# Patient Record
Sex: Male | Born: 1985 | Race: White | Hispanic: No | Marital: Married | Smoking: Never smoker
Health system: Southern US, Community
[De-identification: ages and names within clinical notes are randomized; demographics above are authoritative.]

---

## 2016-03-21 ENCOUNTER — Emergency Department (HOSPITAL_COMMUNITY): Payer: BLUE CROSS/BLUE SHIELD

## 2016-03-21 ENCOUNTER — Encounter (HOSPITAL_COMMUNITY): Payer: Self-pay

## 2016-03-21 ENCOUNTER — Emergency Department (HOSPITAL_COMMUNITY)
Admission: EM | Admit: 2016-03-21 | Discharge: 2016-03-21 | Disposition: A | Discharge status: Home / Self Care | Payer: BLUE CROSS/BLUE SHIELD | Attending: Emergency Medicine | Admitting: Emergency Medicine

## 2016-03-21 DIAGNOSIS — X58XXXA Exposure to other specified factors, initial encounter: Secondary | ICD-10-CM | POA: Insufficient documentation

## 2016-03-21 DIAGNOSIS — Y999 Unspecified external cause status: Secondary | ICD-10-CM | POA: Diagnosis not present

## 2016-03-21 DIAGNOSIS — Y9389 Activity, other specified: Secondary | ICD-10-CM | POA: Diagnosis not present

## 2016-03-21 DIAGNOSIS — M25531 Pain in right wrist: Secondary | ICD-10-CM | POA: Insufficient documentation

## 2016-03-21 DIAGNOSIS — Y929 Unspecified place or not applicable: Secondary | ICD-10-CM | POA: Insufficient documentation

## 2016-03-21 DIAGNOSIS — M25431 Effusion, right wrist: Secondary | ICD-10-CM

## 2016-03-21 LAB — CBC WITH DIFFERENTIAL/PLATELET
BASOS PCT: 0 %
Basophils Absolute: 0 10*3/uL (ref 0.0–0.1)
Eosinophils Absolute: 0.3 10*3/uL (ref 0.0–0.7)
Eosinophils Relative: 3 %
HEMATOCRIT: 40.4 % (ref 39.0–52.0)
HEMOGLOBIN: 14.3 g/dL (ref 13.0–17.0)
LYMPHS PCT: 24 %
Lymphs Abs: 2.3 10*3/uL (ref 0.7–4.0)
MCH: 29.6 pg (ref 26.0–34.0)
MCHC: 35.4 g/dL (ref 30.0–36.0)
MCV: 83.6 fL (ref 78.0–100.0)
MONO ABS: 0.6 10*3/uL (ref 0.1–1.0)
MONOS PCT: 6 %
NEUTROS ABS: 6.4 10*3/uL (ref 1.7–7.7)
NEUTROS PCT: 67 %
Platelets: 209 10*3/uL (ref 150–400)
RBC: 4.83 MIL/uL (ref 4.22–5.81)
RDW: 13.1 % (ref 11.5–15.5)
WBC: 9.7 10*3/uL (ref 4.0–10.5)

## 2016-03-21 LAB — BASIC METABOLIC PANEL
ANION GAP: 9 (ref 5–15)
BUN: 13 mg/dL (ref 6–20)
CHLORIDE: 103 mmol/L (ref 101–111)
CO2: 26 mmol/L (ref 22–32)
CREATININE: 0.88 mg/dL (ref 0.61–1.24)
Calcium: 9.3 mg/dL (ref 8.9–10.3)
GFR calc non Af Amer: >60 mL/min (ref >60)
GLUCOSE: 95 mg/dL (ref 65–99)
Potassium: 3.9 mmol/L (ref 3.5–5.1)
Sodium: 138 mmol/L (ref 135–145)

## 2016-03-21 LAB — SEDIMENTATION RATE: Sed Rate: 2 mm/hr (ref 0–16)

## 2016-03-21 LAB — C-REACTIVE PROTEIN

## 2016-03-21 MED ORDER — KETOROLAC TROMETHAMINE 30 MG/ML IJ SOLN
30.0000 mg | Freq: Once | INTRAMUSCULAR | Status: AC
  Administered 2016-03-21: 30 mg via INTRAVENOUS
  Filled 2016-03-21: qty 1

## 2016-03-21 NOTE — ED Triage Notes (Signed)
Pt states attempting to "lubricate wrist." States injected saline into R wrist yesterday. Pt with swelling and pain to R wrist. Pt with decreased ROM, good cap refill, palpable pulse.

## 2016-03-21 NOTE — ED Notes (Signed)
Pt departed in NAD, refused use of wheelchair.  

## 2016-03-21 NOTE — ED Provider Notes (Signed)
[facility]-EMERGENCY DEPT Provider Note   CSN: [id number] Arrival date & time: 03/21/16  0203  By signing my name below, I, Phillis HaggisGabriella Gaje, attest that this documentation has been prepared under the direction and in the presence of Tomasita CrumbleAdeleke Cristin Szatkowski, MD. Electronically Signed: Phillis HaggisGabriella Gaje, ED Scribe. 03/21/16. 3:39 AM.  History   Chief Complaint Chief Complaint  Patient presents with  . Wrist Pain   The history is provided by the patient. No language interpreter was used.   HPI Comments: Douglas Powers is a 30 y.o. male who presents to the Emergency Department complaining of gradually worsening right wrist pain and swelling onset one day ago. Pt says he tried to "lubricate wrist" by injecting saline to the area two days ago. He woke . 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 He read online that injecting saline water would help with those symptoms, which is why he injected it. Pt got the saline and needle from [facility]. Pt injected about 2 CCs of saline. Pt has worsening pain with movement and palpation. He denies numbness or weakness.   History reviewed. No pertinent past medical history.  There are no active problems to display for this patient.   History reviewed. No pertinent surgical history.   Home Medications    Prior to Admission medications   Not on File    Family History History reviewed. No pertinent family history.  Social History Social History  Substance Use Topics  . Smoking status: Never Smoker  . Smokeless tobacco: Never Used  . Alcohol use No     Allergies   Patient has no allergy information on record.   Review of Systems Review of Systems 10 Systems reviewed and all are negative for acute change except as noted in the HPI.  Physical Exam Updated Vital Signs BP 118/74   Pulse 61   Temp 98 F (36.7 C) (Oral)   Resp 20   Ht 6' (1.829 m)   Wt 200 lb (90.7 kg)   SpO2 97%   BMI 27.12 kg/m   Physical Exam   Constitutional: He is oriented to person, place, and time. Vital signs are normal. He appears well-developed and well-nourished.  Non-toxic appearance. He does not appear ill. No distress.  HENT:  Head: Normocephalic and atraumatic.  Nose: Nose normal.  Mouth/Throat: Oropharynx is clear and moist. No oropharyngeal exudate.  Eyes: Conjunctivae and EOM are normal. Pupils are equal, round, and reactive to light. No scleral icterus.  Neck: Normal range of motion. Neck supple. No tracheal deviation, no edema, no erythema and normal range of motion present. No thyroid mass and no thyromegaly present.  Cardiovascular: Normal rate, regular rhythm, S1 normal, S2 normal, normal heart sounds, intact distal pulses and normal pulses.  Exam reveals no gallop and no friction rub.   No murmur heard. Pulmonary/Chest: Effort normal and breath sounds normal. No respiratory distress. He has no wheezes. He has no rhonchi. He has no rales.  Abdominal: Soft. Normal appearance and bowel sounds are normal. He exhibits no distension, no ascites and no mass. There is no hepatosplenomegaly. There is no tenderness. There is no rebound, no guarding and no CVA tenderness.  Musculoskeletal: He exhibits no edema.       Right wrist: He exhibits decreased range of motion, tenderness and swelling.  TTP over the right wrist diffusely; pain with passive movement  Lymphadenopathy:    He has no cervical adenopathy.  Neurological: He is alert and  oriented to person, place, and time. He has normal strength. No cranial nerve deficit or sensory deficit.  Skin: Skin is warm, dry and intact. No petechiae and no rash noted. He is not diaphoretic. No erythema. No pallor.  Nursing note and vitals reviewed.  ED Treatments / Results  DIAGNOSTIC STUDIES: Oxygen Saturation is 99% on RA, normal by my interpretation.    COORDINATION OF CARE: 3:35 AM-Discussed treatment plan which includes labs with pt at bedside and pt agreed to plan.     Labs (all labs ordered are listed, but only abnormal results are displayed) Labs Reviewed  CBC WITH DIFFERENTIAL/PLATELET  BASIC METABOLIC PANEL  C-REACTIVE PROTEIN  SEDIMENTATION RATE    EKG  EKG Interpretation None       Radiology Dg Wrist Complete Right  Result Date: 03/21/2016 CLINICAL DATA:  injected saline into wrist 2 days ago, now with swelling and pain on right lateral wrist EXAM: RIGHT WRIST - COMPLETE 3+ VIEW COMPARISON:  None. FINDINGS: There is no evidence of fracture or dislocation. There is no evidence of arthropathy or other focal bone abnormality. Soft tissues are unremarkable. No radiopaque soft tissue foreign bodies or soft tissue gas collections. IMPRESSION: Negative. Electronically Signed   By: Burman NievesWilliam  Stevens M.D.   On: 03/21/2016 04:13    Procedures Procedures (including critical care time)  Medications Ordered in ED Medications  ketorolac (TORADOL) 30 MG/ML injection 30 mg (30 mg Intravenous Given 03/21/16 0428)     Initial Impression / Assessment and Plan / ED Course  I have reviewed the triage vital signs and the nursing notes.  Pertinent labs & imaging results that were available during my care of the patient were reviewed by me and considered in my medical decision making (see chart for details).  Clinical Course     Patient presents to the ED for wrist pain after injecting into his joint.  He now has swelling, dec ROM, and pain with passive movement.  I have concern for possible septic joint.  Will obtain labs, xray and consult with hand surgery.  4:57 AM Labs are unremarkable.  I spoke with Dr. Melvyn Novasrtmann who recs to have the patient seen in clinic tomorrow around 3pm.  This was relayed to the patient.  He demonstrates good understanding.  He was given toradol for pain.  Advised for tylenol and ibuprofen at home. He appears well and in NAD.  Vs remain within his normal limits and he is sfae for Dc.   Final Clinical Impressions(s) / ED  Diagnoses   Final diagnoses:  Pain and swelling of right wrist    I personally performed the services described in this documentation, which was scribed in my presence. The recorded information has been reviewed and is accurate.    New Prescriptions New Prescriptions   No medications on file     Tomasita CrumbleAdeleke Amri Lien, MD 03/21/16 0501

## 2016-03-21 NOTE — ED Notes (Signed)
ED Provider at bedside. 

## 2016-03-21 NOTE — ED Notes (Signed)
Patient transported to X-ray 

## 2017-11-12 IMAGING — DX DG WRIST COMPLETE 3+V*R*
4 series · 4 of 4 positions shown · non-contrast
Comparison: None.

CLINICAL DATA: injected saline into wrist 2 days ago, now with
swelling and pain on right lateral wrist

EXAM:
RIGHT WRIST - COMPLETE 3+ VIEW

[wrist pa]
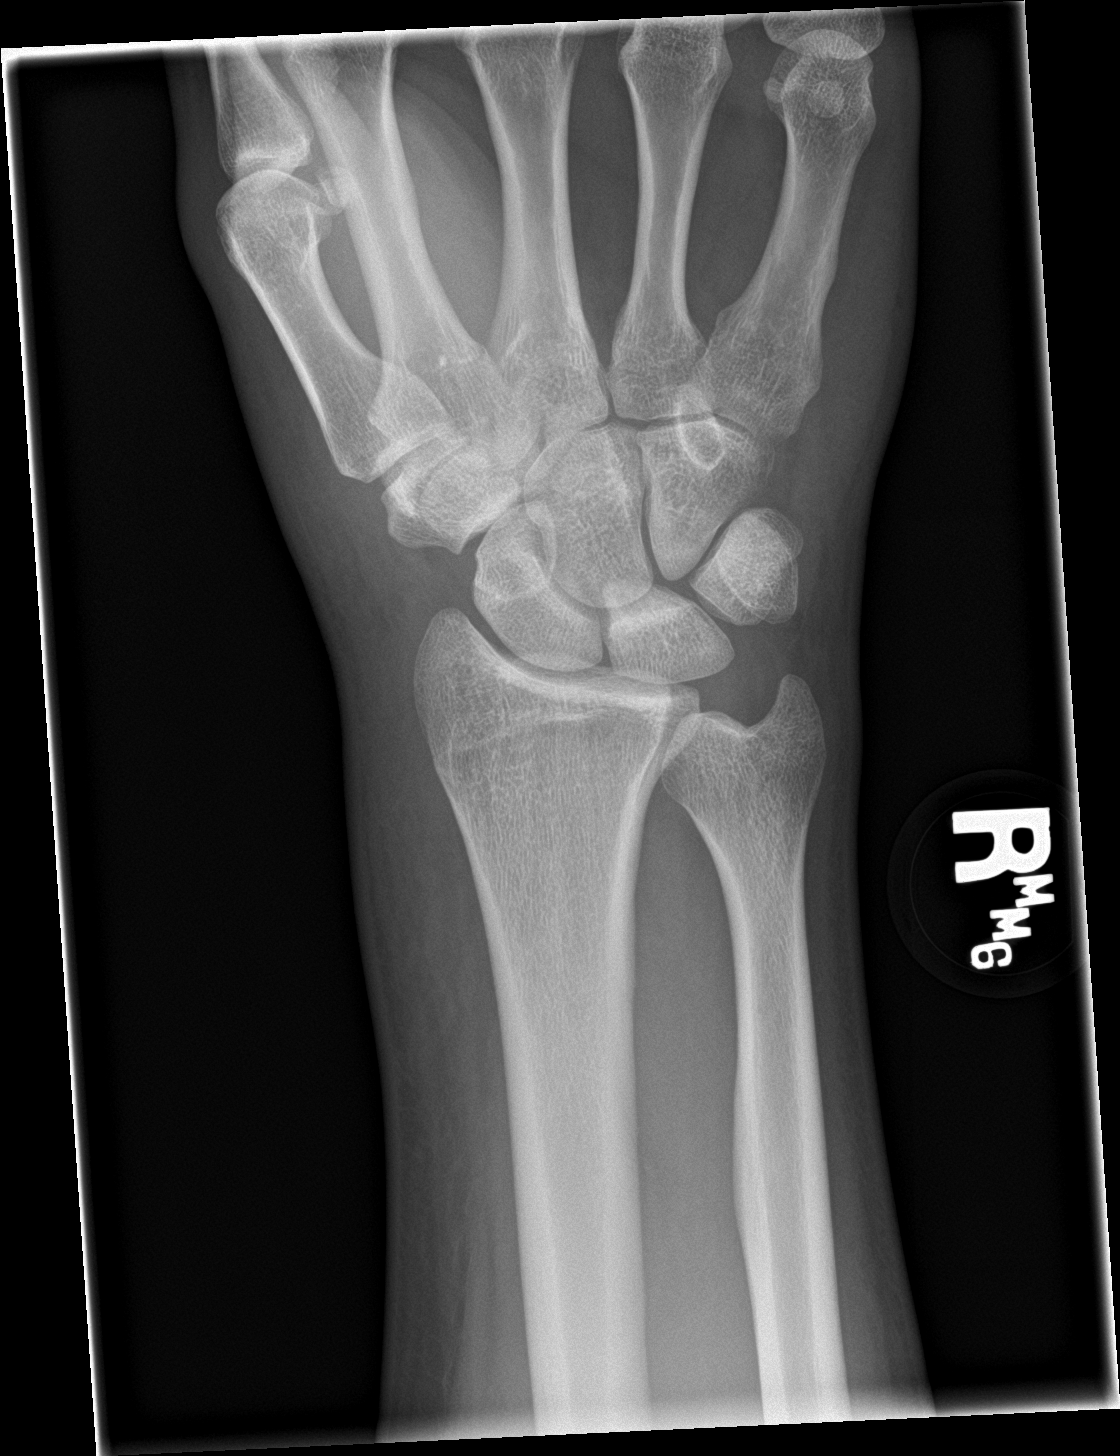

[wrist obl]
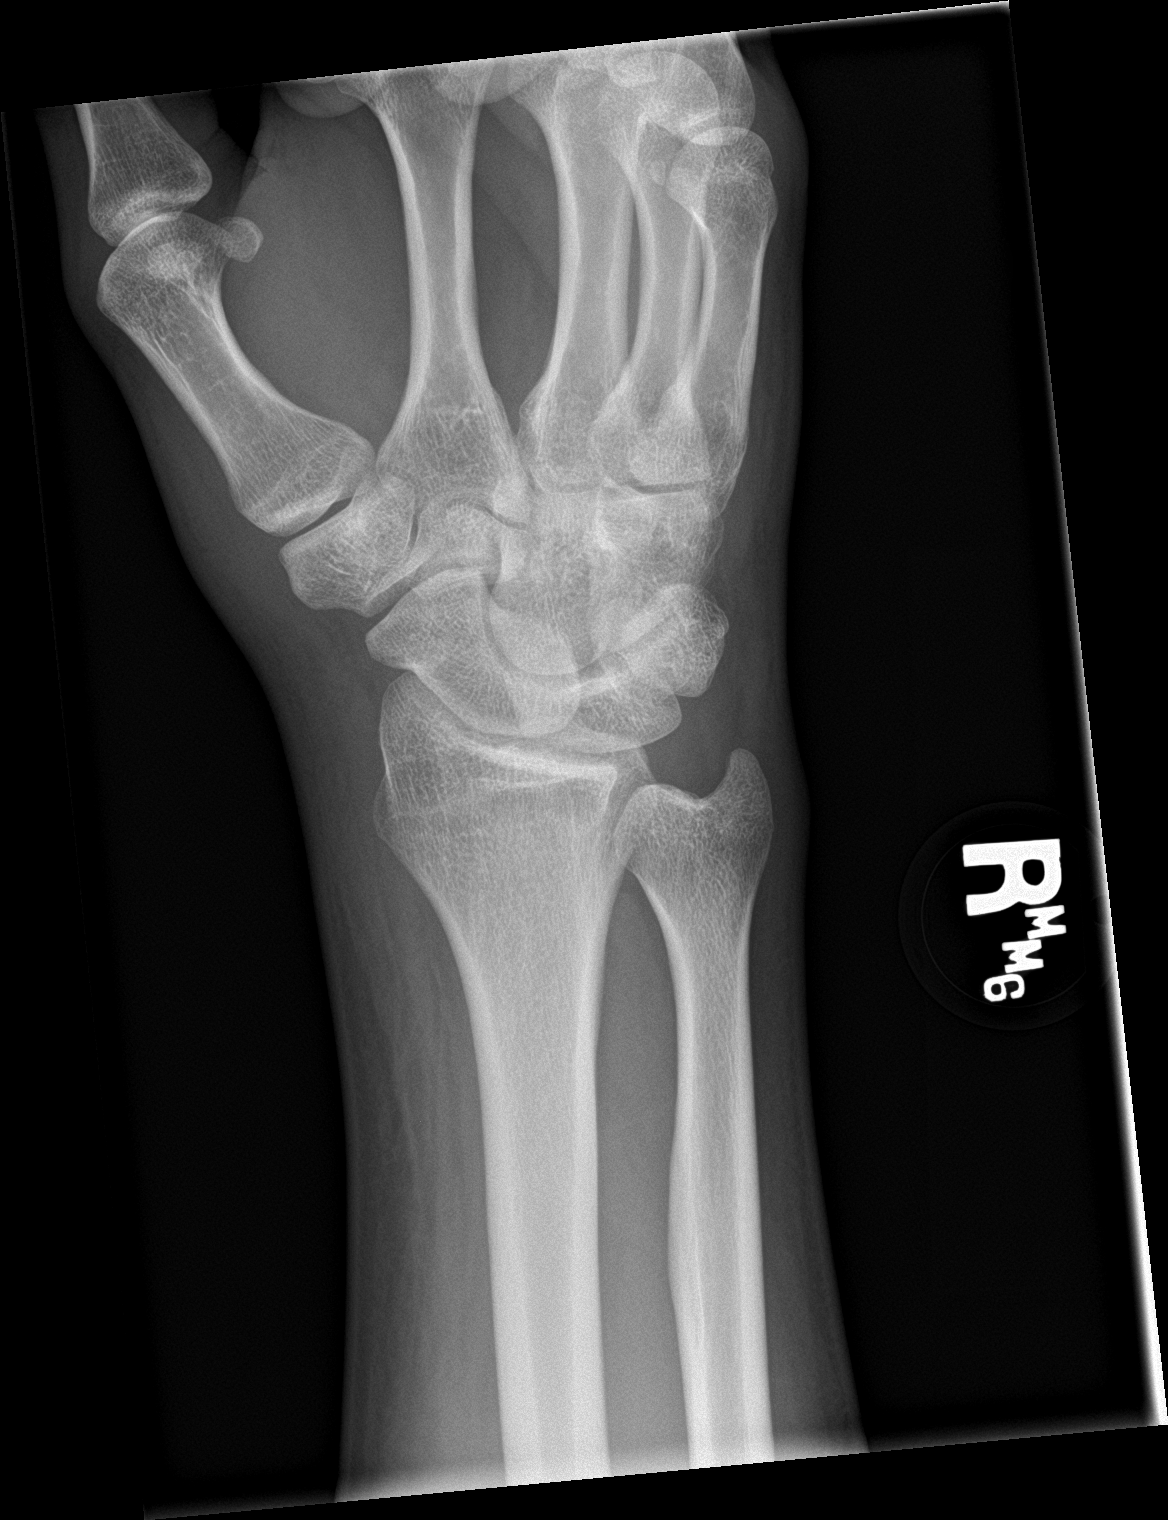

[wrist lat]
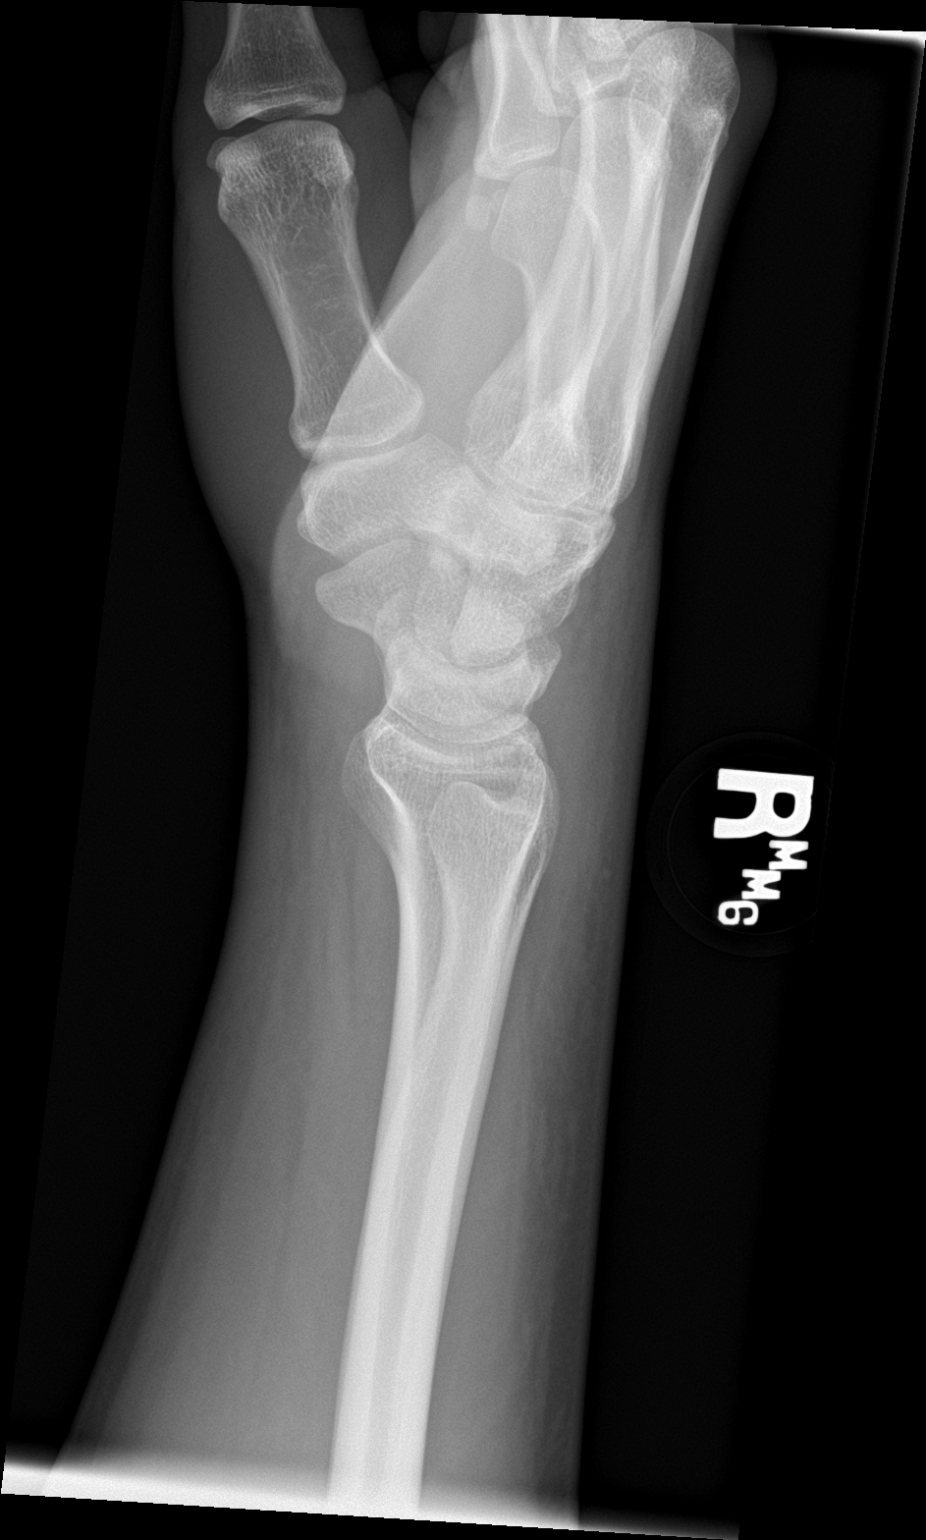

[wrist navicular]
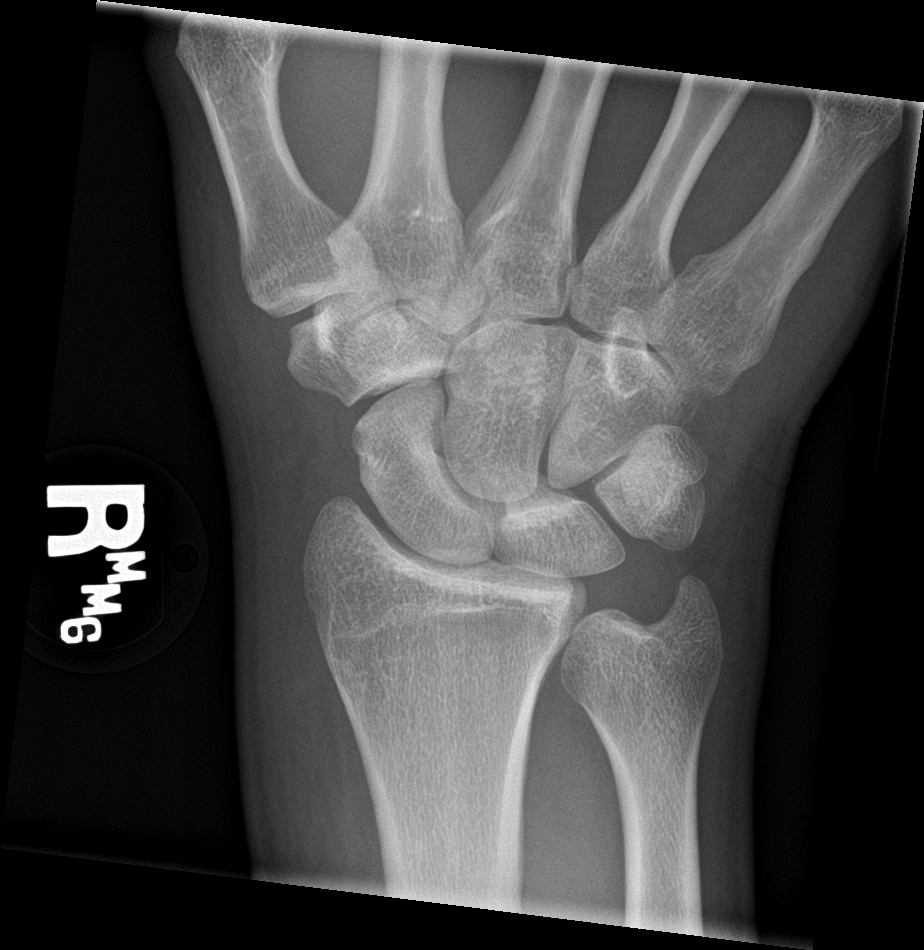

[4 of 4 positions shown; findings below may reference images not displayed]

FINDINGS: There is no evidence of fracture or dislocation. There is no
evidence of arthropathy or other focal bone abnormality. Soft
tissues are unremarkable. No radiopaque soft tissue foreign bodies
or soft tissue gas collections.
IMPRESSION: Negative.
